# Patient Record
Sex: Female | Born: 2005 | Hispanic: No | Marital: Single | Smoking: Never smoker
Health system: Southern US, Community
[De-identification: ages and names within clinical notes are randomized; demographics above are authoritative.]

## PROBLEM LIST (undated history)

## (undated) DIAGNOSIS — S93401A Sprain of unspecified ligament of right ankle, initial encounter: Secondary | ICD-10-CM

## (undated) DIAGNOSIS — S59901A Unspecified injury of right elbow, initial encounter: Secondary | ICD-10-CM

## (undated) HISTORY — DX: Sprain of unspecified ligament of right ankle, initial encounter: S93.401A

## (undated) HISTORY — PX: NO PAST SURGERIES: SHX2092

## (undated) HISTORY — DX: Unspecified injury of right elbow, initial encounter: S59.901A

---
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
## 2023-04-05 ENCOUNTER — Telehealth: Payer: Self-pay

## 2023-04-05 NOTE — Telephone Encounter (Signed)
LVM for patient to call back [phone], or to call PCP office to schedule follow up apt. AS, CMA
